# Patient Record
Sex: Female | Born: 2000
Health system: Southern US, Community
[De-identification: ages and names within clinical notes are randomized; demographics above are authoritative.]

## PROBLEM LIST (undated history)

## (undated) DIAGNOSIS — Z23 Encounter for immunization: Secondary | ICD-10-CM

## (undated) DIAGNOSIS — F32A Depression, unspecified: Secondary | ICD-10-CM

## (undated) DIAGNOSIS — F419 Anxiety disorder, unspecified: Secondary | ICD-10-CM

## (undated) DIAGNOSIS — F329 Major depressive disorder, single episode, unspecified: Secondary | ICD-10-CM

## (undated) HISTORY — DX: Depression, unspecified: F32.A

## (undated) HISTORY — DX: Anxiety disorder, unspecified: F41.9

## (undated) HISTORY — PX: HAND SURGERY: SHX662

## (undated) HISTORY — DX: Encounter for immunization: Z23

## (undated) HISTORY — PX: TRIGGER FINGER RELEASE: SHX641

---
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
## 2020-04-07 ENCOUNTER — Ambulatory Visit (INDEPENDENT_AMBULATORY_CARE_PROVIDER_SITE_OTHER)
Admission: RE | Admit: 2020-04-07 | Discharge: 2020-04-07 | Disposition: A | Discharge status: Home / Self Care | Payer: No Typology Code available for payment source | Source: Ambulatory Visit

## 2020-04-07 DIAGNOSIS — R42 Dizziness and giddiness: Secondary | ICD-10-CM

## 2020-04-07 MED ORDER — ONDANSETRON HCL 4 MG PO TABS: 4.0000 mg | ORAL_TABLET | Freq: Four times a day (QID) | ORAL | 0 refills | Status: DC

## 2020-04-07 NOTE — ED Provider Notes (Signed)
Virtual Visit via Video Note:  Deanna Bryant  initiated request for Telemedicine visit with [facility] team. I connected with Deanna Bryant  on 04/07/2020 at 10:45 AM  for a synchronized telemedicine visit using a video enabled HIPPA compliant telemedicine application. I verified that I am speaking with Deanna Bryant  using two identifiers. Mickie Bail, NP  was physically located in a [facility] Urgent care site and Keara Pagliarulo was located at a different location.   The limitations of evaluation and management by telemedicine as well as the availability of in-person appointments were discussed. Patient was informed that she  may incur a bill ( including co-pay) for this virtual visit encounter. Deanna Bryant  expressed understanding and gave verbal consent to proceed with virtual visit.     History of Present Illness:Deanna Bryant  is a 19 y.o. female presents for evaluation of 3 episodes of "vertigo" since 4 AM today.  She felt like she was "passing out and dizzy while I was asleep" then woke up and vomited in bed.  No history of similar episodes prior to today.  She denies weakness, numbness, headache, palpitations, chest pain, SOB, abdominal pain, dysuria, diarrhea, constipation, rash, or other symptoms.  Treatment attempted at home with increased water intake.  She denies current pregnancy or breastfeeding.      No Known Allergies   Past Medical History:  Diagnosis Date  . Anxiety   . Depression      Social History   Tobacco Use  . Smoking status: Never Smoker  . Smokeless tobacco: Never Used  Vaping Use  . Vaping Use: Every day  Substance Use Topics  . Alcohol use: No  . Drug use: No    ROS: as stated in HPI.  All other systems reviewed and negative.      Observations/Objective: Physical Exam  VITALS: Patient denies fever. GENERAL: Alert, appears well and in no acute distress. HEENT: Atraumatic. Oral mucosa appears moist. NECK: Normal movements of the head and  neck. CARDIOPULMONARY: No increased WOB. Speaking in clear sentences. I:E ratio WNL.  MS: Moves all visible extremities without noticeable abnormality. PSYCH: Pleasant and cooperative, well-groomed. Speech normal rate and rhythm. Affect is appropriate. Insight and judgement are appropriate. Attention is focused, linear, and appropriate.  NEURO: CN grossly intact. Oriented as arrived to appointment on time with no prompting. Moves both UE equally.  SKIN: No obvious lesions, wounds, erythema, or cyanosis noted on face or hands.   Assessment and Plan:    ICD-10-CM   1. Dizziness  R42        Follow Up Instructions: Discussed limitations of video visit.  Treating nausea and vomiting with Zofran.  Instructed patient to come here to be seen in person or go to see her PCP if her symptoms persist or worsen.  Patient agrees to plan of care.    I discussed the assessment and treatment plan with the patient. The patient was provided an opportunity to ask questions and all were answered. The patient agreed with the plan and demonstrated an understanding of the instructions.   The patient was advised to call back or seek an in-person evaluation if the symptoms worsen or if the condition fails to improve as anticipated.      Mickie Bail, NP  04/07/2020 10:45 AM         Mickie Bail, NP 04/07/20 1045

## 2020-04-07 NOTE — Discharge Instructions (Signed)
Take the Zofran as directed.    Follow up with your primary care provider or come here to be seen in person if your symptoms are not improving.

## 2020-06-04 ENCOUNTER — Encounter: Payer: Self-pay | Admitting: Family Medicine

## 2020-06-05 ENCOUNTER — Telehealth (INDEPENDENT_AMBULATORY_CARE_PROVIDER_SITE_OTHER): Payer: No Typology Code available for payment source | Admitting: Family Medicine

## 2020-06-05 ENCOUNTER — Encounter: Payer: Self-pay | Admitting: Family Medicine

## 2020-06-05 VITALS — Ht 65.0 in | Wt 150.0 lb

## 2020-06-05 DIAGNOSIS — Z20822 Contact with and (suspected) exposure to covid-19: Secondary | ICD-10-CM | POA: Diagnosis not present

## 2020-06-05 NOTE — Progress Notes (Signed)
Name: Deanna Bryant   MRN: [id number]    DOB: 09/21/2001   Date:06/05/2020       Progress Note  Subjective:    Chief Complaint  Chief Complaint  Patient presents with  . URI    tested for covid on 23 which was pos(+) started having symptoms on 8/19.  Mom was dx on 17 started quarentining then.  Pt feeling better.  Has vacation planned for sept 1 and will be flying.  Pt has questions and concerns    I connected with  [name]  on 06/05/20 at  1:20 PM EDT by a video enabled telemedicine application and verified that I am speaking with the correct person using two identifiers.  I discussed the limitations of evaluation and management by telemedicine and the availability of in person appointments. The patient expressed understanding and agreed to proceed. Staff also discussed with the patient that there may be a patient responsible charge related to this service. Patient Location: home Provider Location: [facility] Additional Individuals present: none  HPI  Pt has been at home quarantining due to mother having COVID Tested neg on 8/15, pt was asx HA sx started on 05/28/2020 without many other sx Pt feels fine now She has been isolating at home, has not gone to work.   Patient Active Problem List   Diagnosis Date Noted  . Anxiety and depression 05/14/2019    Social History   Tobacco Use  . Smoking status: Current Every Day Smoker    Types: E-cigarettes  . Smokeless tobacco: Never Used  Substance Use Topics  . Alcohol use: No     Current Outpatient Medications:  .  etonogestrel (NEXPLANON) 68 MG IMPL implant, 1 each by Subdermal route once., Disp: , Rfl:   No Known Allergies  I personally reviewed active problem list, medication list, allergies, family history, social history, health maintenance, notes from last encounter, lab results, imaging with the patient/caregiver today.  Review of Systems  10 Systems reviewed and are negative for acute change except as  noted in the HPI.   Objective:   Virtual encounter, vitals limited, only able to obtain the following Today's Vitals   06/05/20 1059  Weight: 150 lb (68 kg)  Height: 5\' 5"  (1.651 m)   Body mass index is 24.96 kg/m. Nursing Note and Vital Signs reviewed.  Physical Exam Vitals and nursing note reviewed.  Constitutional:      General: She is not in acute distress.    Appearance: Normal appearance. She is well-developed. She is not ill-appearing, toxic-appearing or diaphoretic.  HENT:     Head: Normocephalic and atraumatic.  Eyes:     General:        Right eye: No discharge.        Left eye: No discharge.     Conjunctiva/sclera: Conjunctivae normal.  Neck:     Trachea: No tracheal deviation.  Cardiovascular:     Rate and Rhythm: Normal rate.  Pulmonary:     Effort: Pulmonary effort is normal. No respiratory distress.     Breath sounds: No stridor.  Skin:    Coloration: Skin is not jaundiced or pale.     Findings: No rash.  Neurological:     Mental Status: She is alert.  Psychiatric:        Mood and Affect: Mood normal.        Behavior: Behavior normal.     PE limited by telephone encounter  No results found for this or any  previous visit (from the past 72 hour(s)).  Assessment and Plan:     ICD-10-CM   1. Suspected COVID-19 virus infection  Z20.822    encouraged her to retest, but if she does not wish to she would be cleared from isolation and can return to work on 06/08/2020 if fever free and con't asx  2. Close exposure to COVID-19 virus  Z20.822    mother positive, pt has been quarantined, tested negative on 05/24/2020, developed sx on 05/28/2020     -Red flags and when to present for emergency care or RTC including fever >101.43F, chest pain, shortness of breath, new/worsening/un-resolving symptoms, reviewed with patient at time of visit. Follow up and care instructions discussed and provided in AVS. - I discussed the assessment and treatment plan with the  patient. The patient was provided an opportunity to ask questions and all were answered. The patient agreed with the plan and demonstrated an understanding of the instructions.  I provided 20+ minutes of non-face-to-face time during this encounter.  Danelle Berry, PA-C 06/05/20 1:44 PM

## 2020-06-05 NOTE — Patient Instructions (Signed)
Remain isolated until meeting CDC criteria:  . At least 10 days since symptom onset  . And 24 hours fever free without antipyretics  . And improvement in respiratory symptoms.   If symptoms started on 05/28/2020, then isolate through 06/07/2020 and can return to work/school etc on 06/08/2020.  Do not return or break isolation if you have fever or any recurrent or worsening symptoms.  Please consider getting the COVID vaccine to help boost your immune response to COVID and help you and help other prevent spread of illness and variants in the coming months and years.   COVID-19: Quarantine vs. Isolation QUARANTINE keeps someone who was in close contact with someone who has COVID-19 away from others. If you had close contact with a person who has COVID-19  Stay home until 14 days after your last contact.  Check your temperature twice a day and watch for symptoms of COVID-19.  If possible, stay away from people who are at higher-risk for getting very sick from COVID-19. ISOLATION keeps someone who is sick or tested positive for COVID-19 without symptoms away from others, even in their own home. If you are sick and think or know you have COVID-19  Stay home until after ? At least 10 days since symptoms first appeared and ? At least 24 hours with no fever without fever-reducing medication and ? Symptoms have improved If you tested positive for COVID-19 but do not have symptoms  Stay home until after ? 10 days have passed since your positive test If you live with others, stay in a specific "sick room" or area and away from other people or animals, including pets. Use a separate bathroom, if available. [email or web address] 04/29/2019 This information is not intended to replace advice given to you by your health care provider. Make sure you discuss any questions you have with your health care provider. Document Revised: 09/12/2019 Document Reviewed: 09/12/2019 Elsevier Patient Education   2020 [employer].
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
## 2020-07-02 ENCOUNTER — Encounter: Payer: Self-pay | Admitting: Family Medicine

## 2020-07-02 ENCOUNTER — Other Ambulatory Visit (HOSPITAL_COMMUNITY)
Admission: RE | Admit: 2020-07-02 | Discharge: 2020-07-02 | Disposition: A | Discharge status: Home / Self Care | Payer: No Typology Code available for payment source | Source: Ambulatory Visit | Attending: Family Medicine | Admitting: Family Medicine

## 2020-07-02 ENCOUNTER — Other Ambulatory Visit: Payer: Self-pay

## 2020-07-02 ENCOUNTER — Ambulatory Visit (INDEPENDENT_AMBULATORY_CARE_PROVIDER_SITE_OTHER): Payer: No Typology Code available for payment source | Admitting: Family Medicine

## 2020-07-02 VITALS — BP 120/72 | HR 67 | Temp 98.1°F | Resp 16 | Ht 65.0 in | Wt 157.3 lb

## 2020-07-02 DIAGNOSIS — Z0000 Encounter for general adult medical examination without abnormal findings: Secondary | ICD-10-CM

## 2020-07-02 DIAGNOSIS — Z202 Contact with and (suspected) exposure to infections with a predominantly sexual mode of transmission: Secondary | ICD-10-CM

## 2020-07-02 NOTE — Patient Instructions (Signed)
Preventive Care 18-19 Years Old, Female Preventive care refers to lifestyle choices and visits with your health care provider that can promote health and wellness. At this stage in your life, you may start seeing a primary care physician instead of a pediatrician. Your health care is now your responsibility. Preventive care for young adults includes:  A yearly physical exam. This is also called an annual wellness visit.  Regular dental and eye exams.  Immunizations.  Screening for certain conditions.  Healthy lifestyle choices, such as diet and exercise. What can I expect for my preventive care visit? Physical exam Your health care provider may check:  Height and weight. These may be used to calculate body mass index (BMI), which is a measurement that tells if you are at a healthy weight.  Heart rate and blood pressure.  Body temperature. Counseling Your health care provider may ask you questions about:  Past medical problems and family medical history.  Alcohol, tobacco, and drug use.  Home and relationship well-being.  Access to firearms.  Emotional well-being.  Diet, exercise, and sleep habits.  Sexual activity and sexual health.  Method of birth control.  Menstrual cycle.  Pregnancy history. What immunizations do I need?  Influenza (flu) vaccine  This is recommended every year. Tetanus, diphtheria, and pertussis (Tdap) vaccine  You may need a Td booster every 10 years. Varicella (chickenpox) vaccine  You may need this vaccine if you have not already been vaccinated. Human papillomavirus (HPV) vaccine  If recommended by your health care provider, you may need three doses over 6 months. Measles, mumps, and rubella (MMR) vaccine  You may need at least one dose of MMR. You may also need a second dose. Meningococcal conjugate (MenACWY) vaccine  One dose is recommended if you are 19-19 years old and a first-year college student living in a residence hall,  or if you have one of several medical conditions. You may also need additional booster doses. Pneumococcal conjugate (PCV13) vaccine  You may need this if you have certain conditions and were not previously vaccinated. Pneumococcal polysaccharide (PPSV23) vaccine  You may need one or two doses if you smoke cigarettes or if you have certain conditions. Hepatitis A vaccine  You may need this if you have certain conditions or if you travel or work in places where you may be exposed to hepatitis A. Hepatitis B vaccine  You may need this if you have certain conditions or if you travel or work in places where you may be exposed to hepatitis B. Haemophilus influenzae type b (Hib) vaccine  You may need this if you have certain risk factors. You may receive vaccines as individual doses or as more than one vaccine together in one shot (combination vaccines). Talk with your health care provider about the risks and benefits of combination vaccines. What tests do I need? Blood tests  Lipid and cholesterol levels. These may be checked every 5 years starting at age 20.  Hepatitis C test.  Hepatitis B test. Screening  Pelvic exam and Pap test. This may be done every 3 years starting at age 19.  Sexually transmitted disease (STD) testing, if you are at risk.  BRCA-related cancer screening. This may be done if you have a family history of breast, ovarian, tubal, or peritoneal cancers. Other tests  Tuberculosis skin test.  Vision and hearing tests.  Skin exam.  Breast exam. Follow these instructions at home: Eating and drinking   Eat a diet that includes fresh fruits and   vegetables, whole grains, lean protein, and low-fat dairy products.  Drink enough fluid to keep your urine pale yellow.  Do not drink alcohol if: ? Your health care provider tells you not to drink. ? You are pregnant, may be pregnant, or are planning to become pregnant. ? You are under the legal drinking age. In the  [place] the legal drinking age is 55.  If you drink alcohol: ? Limit how much you have to 0-1 drink a day. ? Be aware of how much alcohol is in your drink. In the [place] one drink equals one 12 oz bottle of beer (355 mL), one 5 oz glass of wine (148 mL), or one 1 oz glass of hard liquor (44 mL). Lifestyle  Take daily care of your teeth and gums.  Stay active. Exercise at least 30 minutes 5 or more days of the week.  Do not use any products that contain nicotine or tobacco, such as cigarettes, e-cigarettes, and chewing tobacco. If you need help quitting, ask your health care provider.  Do not use drugs.  If you are sexually active, practice safe sex. Use a condom or other form of birth control (contraception) in order to prevent pregnancy and STIs (sexually transmitted infections). If you plan to become pregnant, see your health care provider for a pre-conception visit.  Find healthy ways to cope with stress, such as: ? Meditation, yoga, or listening to music. ? Journaling. ? Talking to a trusted person. ? Spending time with friends and family. Safety  Always wear your seat belt while driving or riding in a vehicle.  Do not drive if you have been drinking alcohol. Do not ride with someone who has been drinking.  Do not drive when you are tired or distracted. Do not text while driving.  Wear a helmet and other protective equipment during sports activities.  If you have firearms in your house, make sure you follow all gun safety procedures.  Seek help if you have been bullied, physically abused, or sexually abused.  Use the Internet responsibly to avoid dangers such as online bullying and online sex predators. What's next?  Go to your health care provider once a year for a well check visit.  Ask your health care provider how often you should have your eyes and teeth checked.  Stay up to date on all vaccines. This information is not intended to replace advice given to you by  your health care provider. Make sure you discuss any questions you have with your health care provider. Document Revised: 09/20/2018 Document Reviewed: 09/20/2018 Elsevier Patient Education  2020 [employer] Sexually Transmitted Infections, Adult Sexually transmitted infections (STIs) are diseases that are passed (transmitted) from person to person through bodily fluids exchanged during sex or sexual contact. Bodily fluids include saliva, semen, blood, vaginal mucus, and urine. You may have an increased risk for developing an STI if you have unprotected oral, vaginal, or anal sex. Some common STIs include:  Herpes.  Hepatitis B.  Chlamydia.  Gonorrhea.  Syphilis.  HPV (human papillomavirus).  HIV (human immunodeficiency virus), the virus that can cause AIDS (acquired immunodeficiency syndrome). How can I protect myself from sexually transmitted infections? The only way to completely prevent STIs is not to have sex of any kind (practice abstinence). This includes oral, vaginal, or anal sex. If you are sexually active, take these actions to lower your risk of getting an STI:  Have only one sex partner (be monogamous) or limit  the number of sexual partners you have.  Stay up-to-date on immunizations. Certain vaccines can lower your risk of getting certain STIs, such as: ? Hepatitis A and B vaccines. You may have been vaccinated as a young child, but likely need a booster shot as a teen or young adult. ? HPV vaccine.  Use methods that prevent the exchange of body fluids between partners (barrier protection) every time you have sex. Barrier protection can be used during oral, vaginal, or anal sex. Commonly used barrier methods include: ? Female condom. ? Female condom. ? Dental dam.  Get tested regularly for STIs. Have your sexual partner get tested regularly as well.  Avoid mixing alcohol, drugs, and sex. Alcohol and drug use can affect your ability to make good  decisions and can lead to risky sexual behaviors.  Ask your health care provider about taking pre-exposure prophylaxis (PrEP) to prevent HIV infection if you: ? Have a HIV-positive sexual partner. ? Have multiple sexual partners or partners who do not know their HIV status, and do not regularly use a condom during sex. ? Use injection drugs and share needles. Birth control pills, injections, implants, and intrauterine devices (IUDs) do not protect against STIs. To prevent both STIs and pregnancy, always use a condom with another form of birth control. Some STIs, such as herpes, are spread through skin to skin contact. A condom does not protect you from getting such STIs. If you or your partner have herpes and there is an active flare with open sores, avoid all sexual contact. Why are these changes important? Taking steps to practice safe sex protects you and others. Many STIs can be cured. However, some STIs are not curable and will affect you for the rest of your life. STIs can be passed on to another person even if you do not have symptoms. What can happen if changes are not made? Certain STIs may:  Require you to take medicine for the rest of your life.  Affect your ability to have children (your fertility).  Increase your risk for developing another STI or certain serious health conditions, such as: ? Cervical cancer. ? Head and neck cancer. ? Pelvic inflammatory disease (PID) in women. ? Organ damage or damage to other parts of your body, if the infection spreads.  Be passed to a baby during childbirth. How are sexually transmitted infections treated? If you or your partner know or think that you may have an STI:  Talk with your health care provider about what can be done to treat it. Some STIs can be treated and cured with medicines.  For curable STIs, you and your partner should avoid sex during treatment and for several days after treatment is complete.  You and your partner  should both be treated at the same time, if there is any chance that your partner is infected as well. If you get treatment but your partner does not, your partner can re-infect you when you resume sexual contact.  Do not have unprotected sex. Where to find more information Learn more about sexually transmitted diseases and infections from:  Centers for Disease Control and Prevention: ? More information about specific STIs: AppraiserFraud.fi ? Find places to get sexual health counseling and treatment for free or for a low cost: gettested.StoreMirror.com.cy  [place] Department of Health and Human Services: http://white.info/.html Summary  The only way to completely prevent STIs is not to have sex (practice abstinence), including oral, vaginal, or anal sex.  STIs can spread through saliva,  semen, blood, vaginal mucus, urine, or sexual contact.  If you do have sex, limit your number of sexual partners and use a barrier protection method every time you have sex.  If you develop an STI, get treated right away and ask your partner to be treated as well. Do not resume having sex until both of you have completed treatment for the STI. This information is not intended to replace advice given to you by your health care provider. Make sure you discuss any questions you have with your health care provider. Document Revised: 02/19/2019 Document Reviewed: 09/22/2016 Elsevier Patient Education  2020 [employer].

## 2020-07-03 ENCOUNTER — Other Ambulatory Visit: Payer: Self-pay | Admitting: Family Medicine

## 2020-07-03 LAB — CERVICOVAGINAL ANCILLARY ONLY
Bacterial Vaginitis (gardnerella): POSITIVE — AB
Candida Glabrata: NEGATIVE
Candida Vaginitis: POSITIVE — AB
Chlamydia: NEGATIVE
Comment: NEGATIVE
Comment: NEGATIVE
Comment: NEGATIVE
Comment: NEGATIVE
Comment: NEGATIVE
Comment: NORMAL
Neisseria Gonorrhea: NEGATIVE
Trichomonas: NEGATIVE

## 2020-07-03 LAB — CBC WITH DIFFERENTIAL/PLATELET
Absolute Monocytes: 599 cells/uL (ref 200–950)
Basophils Absolute: 38 cells/uL (ref 0–200)
Basophils Relative: 0.6 %
Eosinophils Absolute: 88 cells/uL (ref 15–500)
Eosinophils Relative: 1.4 %
HCT: 41.6 % (ref 35.0–45.0)
Hemoglobin: 13.9 g/dL (ref 11.7–15.5)
Lymphs Abs: 1613 cells/uL (ref 850–3900)
MCH: 29.4 pg (ref 27.0–33.0)
MCHC: 33.4 g/dL (ref 32.0–36.0)
MCV: 88.1 fL (ref 80.0–100.0)
MPV: 10.8 fL (ref 7.5–12.5)
Monocytes Relative: 9.5 %
Neutro Abs: 3963 cells/uL (ref 1500–7800)
Neutrophils Relative %: 62.9 %
Platelets: 209 10*3/uL (ref 140–400)
RBC: 4.72 10*6/uL (ref 3.80–5.10)
RDW: 12.4 % (ref 11.0–15.0)
Total Lymphocyte: 25.6 %
WBC: 6.3 10*3/uL (ref 3.8–10.8)

## 2020-07-03 LAB — COMPLETE METABOLIC PANEL WITH GFR
AG Ratio: 1.5 (calc) (ref 1.0–2.5)
ALT: 7 U/L (ref 5–32)
AST: 12 U/L (ref 12–32)
Albumin: 4.5 g/dL (ref 3.6–5.1)
Alkaline phosphatase (APISO): 54 U/L (ref 36–128)
BUN: 7 mg/dL (ref 7–20)
CO2: 27 mmol/L (ref 20–32)
Calcium: 10 mg/dL (ref 8.9–10.4)
Chloride: 104 mmol/L (ref 98–110)
Creat: 0.76 mg/dL (ref 0.50–1.00)
GFR, Est African American: 132 mL/min/{1.73_m2} (ref >=60)
GFR, Est Non African American: 114 mL/min/{1.73_m2} (ref >=60)
Globulin: 3.1 g/dL (calc) (ref 2.0–3.8)
Glucose, Bld: 83 mg/dL (ref 65–99)
Potassium: 4.2 mmol/L (ref 3.8–5.1)
Sodium: 138 mmol/L (ref 135–146)
Total Bilirubin: 0.9 mg/dL (ref 0.2–1.1)
Total Protein: 7.6 g/dL (ref 6.3–8.2)

## 2020-07-03 LAB — HIV ANTIBODY (ROUTINE TESTING W REFLEX): HIV 1&2 Ab, 4th Generation: NONREACTIVE

## 2020-07-03 LAB — RPR: RPR Ser Ql: NONREACTIVE

## 2020-07-03 MED ORDER — FLUCONAZOLE 150 MG PO TABS: 150.0000 mg | ORAL_TABLET | Freq: Once | ORAL | 0 refills | Status: AC

## 2020-07-03 MED ORDER — METRONIDAZOLE 500 MG PO TABS: 500.0000 mg | ORAL_TABLET | Freq: Two times a day (BID) | ORAL | 0 refills | Status: DC

## 2020-07-16 ENCOUNTER — Encounter: Payer: No Typology Code available for payment source | Admitting: Family Medicine

## 2020-12-28 NOTE — Patient Instructions (Signed)
I value your feedback and you entrusting us with your care. If you get a [facility] patient survey, I would appreciate you taking the time to let us know about your experience today. Thank you! ? ? ?
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
## 2020-12-29 ENCOUNTER — Ambulatory Visit (INDEPENDENT_AMBULATORY_CARE_PROVIDER_SITE_OTHER): Payer: No Typology Code available for payment source | Admitting: Obstetrics and Gynecology

## 2020-12-29 ENCOUNTER — Other Ambulatory Visit: Payer: Self-pay

## 2020-12-29 ENCOUNTER — Other Ambulatory Visit (HOSPITAL_COMMUNITY)
Admission: RE | Admit: 2020-12-29 | Discharge: 2020-12-29 | Disposition: A | Discharge status: Home / Self Care | Payer: No Typology Code available for payment source | Source: Ambulatory Visit | Attending: Obstetrics and Gynecology | Admitting: Obstetrics and Gynecology

## 2020-12-29 ENCOUNTER — Encounter: Payer: Self-pay | Admitting: Obstetrics and Gynecology

## 2020-12-29 VITALS — BP 100/60 | Ht 65.0 in | Wt 152.0 lb

## 2020-12-29 DIAGNOSIS — Z113 Encounter for screening for infections with a predominantly sexual mode of transmission: Secondary | ICD-10-CM | POA: Insufficient documentation

## 2020-12-29 DIAGNOSIS — N94819 Vulvodynia, unspecified: Secondary | ICD-10-CM

## 2020-12-29 DIAGNOSIS — N930 Postcoital and contact bleeding: Secondary | ICD-10-CM | POA: Insufficient documentation

## 2020-12-29 DIAGNOSIS — Z01419 Encounter for gynecological examination (general) (routine) without abnormal findings: Secondary | ICD-10-CM | POA: Diagnosis not present

## 2020-12-29 DIAGNOSIS — Z3046 Encounter for surveillance of implantable subdermal contraceptive: Secondary | ICD-10-CM | POA: Diagnosis not present

## 2020-12-29 DIAGNOSIS — N941 Unspecified dyspareunia: Secondary | ICD-10-CM | POA: Diagnosis not present

## 2020-12-31 LAB — CERVICOVAGINAL ANCILLARY ONLY
Chlamydia: NEGATIVE
Comment: NEGATIVE
Comment: NORMAL
Neisseria Gonorrhea: NEGATIVE

## 2021-01-01 NOTE — Addendum Note (Signed)
Addended by: Althea Grimmer B on: 01/01/2021 10:58 AM   Modules accepted: Orders

## 2021-01-05 ENCOUNTER — Other Ambulatory Visit: Payer: Self-pay | Admitting: Family Medicine

## 2021-01-05 ENCOUNTER — Other Ambulatory Visit: Payer: Self-pay

## 2021-01-05 ENCOUNTER — Ambulatory Visit (INDEPENDENT_AMBULATORY_CARE_PROVIDER_SITE_OTHER): Payer: No Typology Code available for payment source

## 2021-01-05 ENCOUNTER — Ambulatory Visit
Admission: EM | Admit: 2021-01-05 | Discharge: 2021-01-05 | Disposition: A | Discharge status: Home / Self Care | Payer: No Typology Code available for payment source | Attending: Family Medicine | Admitting: Family Medicine

## 2021-01-05 ENCOUNTER — Encounter: Payer: Self-pay | Admitting: Emergency Medicine

## 2021-01-05 DIAGNOSIS — W1830XA Fall on same level, unspecified, initial encounter: Secondary | ICD-10-CM

## 2021-01-05 DIAGNOSIS — S93601A Unspecified sprain of right foot, initial encounter: Secondary | ICD-10-CM

## 2021-01-05 DIAGNOSIS — S93602A Unspecified sprain of left foot, initial encounter: Secondary | ICD-10-CM

## 2021-01-05 MED ORDER — MELOXICAM 15 MG PO TABS: 15.0000 mg | ORAL_TABLET | Freq: Every day | ORAL | 0 refills | Status: DC | PRN

## 2021-01-05 NOTE — Discharge Instructions (Signed)
Rest, ice, elevation.  Take care  Dr. Adriana Simas

## 2021-01-05 NOTE — ED Triage Notes (Signed)
Patient states she was running with her dogs yesterday and tripped and fell. She is c/o right ankle/foot pain and left foot pain on the inside of her foot.

## 2021-01-05 NOTE — ED Provider Notes (Signed)
[facility]    CSN: [id number] Arrival date & time: 01/05/21  1206   History   Chief Complaint Chief Complaint  Patient presents with  . Foot Pain  . Foot Injury        HPI  20 year old female presents with the above complaints.  Patient states that she was running with her dog yesterday.  She got tripped and fell.  She reports pain of her right and left feet.  Pain is 5/10 in severity.  Worse with bearing weight.  She is able to walk.  No relieving factors.  She has taken over-the-counter analgesics without improvement.  No other complaints.  Past Medical History:  Diagnosis Date  . Anxiety   . Depression   . Vaccine for human papilloma virus (HPV) types 6, 11, 16, and 18 administered     Patient Active Problem List   Diagnosis Date Noted  . Dyspareunia in female 12/29/2020  . Vulvodynia 12/29/2020  . Anxiety and depression 05/14/2019    Past Surgical History:  Procedure Laterality Date  . HAND SURGERY    . TRIGGER FINGER RELEASE      OB History    Gravida  0   Para  0   Term  0   Preterm  0   AB  0   Living  0     SAB  0   IAB  0   Ectopic  0   Multiple  0   Live Births  0            Home Medications    Prior to Admission medications   Medication Sig Start Date End Date Taking? Authorizing Provider  meloxicam (MOBIC) 15 MG tablet Take 1 tablet (15 mg total) by mouth daily as needed for pain. 01/05/21  Yes Norva Bowe G, DO  etonogestrel (NEXPLANON) 68 MG IMPL implant 1 each by Subdermal route once. Early 2021 approx insertion date    [provider]    Family History Family History  Problem Relation Age of Onset  . Diabetes Mother   . Thyroid cancer Mother   . Diabetes Father   . Diabetes Maternal Grandfather     Social History Social History   Tobacco Use  . Smoking status: Current Every Day Smoker    Types: E-cigarettes  . Smokeless tobacco: Never Used  Vaping Use  . Vaping Use: Every day   Substance Use Topics  . Alcohol use: No  . Drug use: No     Allergies   Patient has no known allergies.   Review of Systems Review of Systems  Constitutional: Negative.   Musculoskeletal:       Foot pain.   Physical Exam Triage Vital Signs ED Triage Vitals  Enc Vitals Group     BP 01/05/21 1225 109/60     Pulse Rate 01/05/21 1225 76     Resp 01/05/21 1224 18     Temp 01/05/21 1224 98.2 F (36.8 C)     Temp Source 01/05/21 1224 Oral     SpO2 01/05/21 1225 100 %     Weight 01/05/21 1223 150 lb (68 kg)     Height 01/05/21 1223 5\' 5"  (1.651 m)     Head Circumference --      Peak Flow --      Pain Score 01/05/21 1222 5     Pain Loc --      Pain Edu? --      Excl. in GC? --  Updated Vital Signs BP 109/60   Pulse 76   Temp 98.2 F (36.8 C) (Oral)   Resp 18   Ht 5\' 5"  (1.651 m)   Wt 68 kg   LMP 12/12/2020 (Exact Date)   SpO2 100%   BMI 24.96 kg/m   Visual Acuity Right Eye Distance:   Left Eye Distance:   Bilateral Distance:    Right Eye Near:   Left Eye Near:    Bilateral Near:     Physical Exam Constitutional:      General: She is not in acute distress.    Appearance: Normal appearance. She is not ill-appearing.  Eyes:     General:        Right eye: No discharge.        Left eye: No discharge.     Conjunctiva/sclera: Conjunctivae normal.  Pulmonary:     Effort: Pulmonary effort is normal. No respiratory distress.  Musculoskeletal:     Comments: Left foot -no significant tenderness on exam.  Right foot -tenderness over the base of the fifth metatarsal.  Neurological:     Mental Status: She is alert.  Psychiatric:        Mood and Affect: Mood normal.        Behavior: Behavior normal.    UC Treatments / Results  Labs (all labs ordered are listed, but only abnormal results are displayed) Labs Reviewed - No data to display  EKG   Radiology DG Foot Complete Left  Result Date: 01/05/2021 CLINICAL DATA:  Patient states she was running  with her dogs yesterday and tripped and fell. She is c/o right ankle/foot pain and left foot pain on the inside of her foot. pain left medial IP joint 1st MT. Most pain right foot across top and la.*comment was truncated*Fall yesterday EXAM: LEFT FOOT - COMPLETE 3+ VIEW COMPARISON:  None. FINDINGS: No fracture or dislocation of mid foot or forefoot. The phalanges are normal. The calcaneus is normal. No soft tissue abnormality. IMPRESSION: No fracture or dislocation. Electronically Signed   By: 01/07/2021 M.D.   On: 01/05/2021 13:18   DG Foot Complete Right  Result Date: 01/05/2021 CLINICAL DATA:  Fall yesterday with medial foot pain EXAM: RIGHT FOOT COMPLETE - 3+ VIEW COMPARISON:  None. FINDINGS: No fracture or dislocation. Lisfranc joint appears intact. No focal osseous lesions. No significant arthropathy. No radiopaque foreign bodies. IMPRESSION: No fracture or malalignment. Electronically Signed   By: 01/07/2021 M.D.   On: 01/05/2021 13:20    Procedures Procedures (including critical care time)  Medications Ordered in UC Medications - No data to display  Initial Impression / Assessment and Plan / UC Course  I have reviewed the triage vital signs and the nursing notes.  Pertinent labs & imaging results that were available during my care of the patient were reviewed by me and considered in my medical decision making (see chart for details).    20 year old female presents with injury to both feet.  X-rays were obtained and were independent reviewed by me.  Interpretation: Normal x-rays of the left and right feet.  No appreciable fracture or dislocation.  Advise rest, ice, elevation.  Meloxicam as directed.  Supportive care.  Final Clinical Impressions(s) / UC Diagnoses   Final diagnoses:  Sprain of right foot, initial encounter  Sprain of left foot, initial encounter     Discharge Instructions     Rest, ice, elevation.  Take care  Dr. 26    ED Prescriptions  Medication Sig Dispense Auth. Provider   meloxicam (MOBIC) 15 MG tablet Take 1 tablet (15 mg total) by mouth daily as needed for pain. 30 tablet Tommie Sams, DO     PDMP not reviewed this encounter.   Tommie Sams, [place] 01/05/21 1409

## 2021-02-12 ENCOUNTER — Ambulatory Visit
Admission: RE | Admit: 2021-02-12 | Discharge: 2021-02-12 | Disposition: A | Discharge status: Home / Self Care | Payer: No Typology Code available for payment source | Source: Ambulatory Visit | Attending: Obstetrics and Gynecology | Admitting: Obstetrics and Gynecology

## 2021-02-12 ENCOUNTER — Other Ambulatory Visit: Payer: Self-pay

## 2021-02-12 DIAGNOSIS — N930 Postcoital and contact bleeding: Secondary | ICD-10-CM | POA: Diagnosis present

## 2021-02-12 DIAGNOSIS — N941 Unspecified dyspareunia: Secondary | ICD-10-CM | POA: Diagnosis not present

## 2021-02-24 ENCOUNTER — Other Ambulatory Visit: Payer: Self-pay

## 2021-02-24 MED ORDER — NAPROXEN 500 MG PO TABS
ORAL_TABLET | ORAL | 1 refills | Status: DC
  Filled 2021-02-24: qty 20, 10d supply, fill #0

## 2021-02-24 MED ORDER — CYCLOBENZAPRINE HCL 5 MG PO TABS
ORAL_TABLET | ORAL | 0 refills | Status: DC
  Filled 2021-02-24: qty 30, 10d supply, fill #0

## 2021-04-27 ENCOUNTER — Ambulatory Visit
Admission: EM | Admit: 2021-04-27 | Discharge: 2021-04-27 | Disposition: A | Discharge status: Home / Self Care | Payer: No Typology Code available for payment source | Attending: Physician Assistant | Admitting: Physician Assistant

## 2021-04-27 ENCOUNTER — Other Ambulatory Visit: Payer: Self-pay

## 2021-04-27 ENCOUNTER — Encounter: Payer: Self-pay | Admitting: Emergency Medicine

## 2021-04-27 ENCOUNTER — Encounter: Payer: Self-pay | Admitting: Family Medicine

## 2021-04-27 DIAGNOSIS — J029 Acute pharyngitis, unspecified: Secondary | ICD-10-CM | POA: Diagnosis present

## 2021-04-27 DIAGNOSIS — R059 Cough, unspecified: Secondary | ICD-10-CM | POA: Insufficient documentation

## 2021-04-27 DIAGNOSIS — J039 Acute tonsillitis, unspecified: Secondary | ICD-10-CM | POA: Insufficient documentation

## 2021-04-27 LAB — POCT RAPID STREP A: Streptococcus, Group A Screen (Direct): NEGATIVE

## 2021-04-27 MED ORDER — AZITHROMYCIN 250 MG PO TABS
250.0000 mg | ORAL_TABLET | Freq: Every day | ORAL | 0 refills | Status: DC
  Filled 2021-04-27: qty 6, 5d supply, fill #0

## 2021-04-27 MED ORDER — PREDNISONE 20 MG PO TABS
40.0000 mg | ORAL_TABLET | Freq: Every day | ORAL | 0 refills | Status: AC
  Filled 2021-04-27: qty 10, 5d supply, fill #0

## 2021-04-27 NOTE — Discharge Instructions (Addendum)
Your strep test was negative. I will still treat your for possibility of bacterial tonsil infection since you have had symptoms for a month, but as we discussed this could be viral such as mono but I do not have a way to test you this evening. Also it would not necessarily change the course of treatment. Increase rest and fluids. See your PCP if not feeling better in a week.

## 2021-04-27 NOTE — ED Triage Notes (Signed)
Pt c/o cough, hoarseness and sore throat. Started about a month ago. She states she has taken 2 covid test and were negative. Denies fever.

## 2021-04-27 NOTE — ED Provider Notes (Signed)
[facility]    CSN: [id number] Arrival date & time: 04/27/21  1825      History   Chief Complaint Chief Complaint  Patient presents with   Cough    HPI Deanna Bryant is a 20 y.o. female presenting for approximately 1 month history of a sore throat, swollen ankles, voice hoarseness and cough.  Patient says that her boyfriend is ill with similar symptoms and so are multiple family members of his.  She says they have all been sick for a month.  She says she is taking 2 COVID tests which have been negative.  She denies any known COVID exposure.  She has not had any fevers, fatigue, body aches, congestion, chest pain, shortness of breath, nausea/vomiting or diarrhea.  Patient says she is taking multiple over-the-counter medications without relief of her symptoms.  No other complaints.  HPI  Past Medical History:  Diagnosis Date   Anxiety    Depression    Vaccine for human papilloma virus (HPV) types 6, 11, 16, and 18 administered     Patient Active Problem List   Diagnosis Date Noted   Dyspareunia in female 12/29/2020   Vulvodynia 12/29/2020   Anxiety and depression 05/14/2019    Past Surgical History:  Procedure Laterality Date   HAND SURGERY     TRIGGER FINGER RELEASE      OB History     Gravida  0   Para  0   Term  0   Preterm  0   AB  0   Living  0      SAB  0   IAB  0   Ectopic  0   Multiple  0   Live Births  0            Home Medications    Prior to Admission medications   Medication Sig Start Date End Date Taking? Authorizing Provider  azithromycin (ZITHROMAX) 250 MG tablet Take 1 tablet (250 mg total) by mouth daily. Take first 2 tablets together, then 1 every day until finished. 04/27/21  Yes Shirlee Latch, PA-C  etonogestrel (NEXPLANON) 68 MG IMPL implant 1 each by Subdermal route once. Early 2021 approx insertion date   Yes [provider]  predniSONE (DELTASONE) 20 MG tablet Take 2 tablets (40 mg total) by  mouth daily for 5 days. 04/27/21 05/02/21 Yes Shirlee Latch, PA-C  cyclobenzaprine (FLEXERIL) 5 MG tablet Take 1 tablet (5 mg total) by mouth 3 (three) times daily as needed for Muscle spasms for up to 10 days 02/24/21     meloxicam (MOBIC) 15 MG tablet TAKE 1 TABLET (15 MG TOTAL) BY MOUTH DAILY AS NEEDED FOR PAIN. 01/05/21 01/05/22  Tommie Sams, DO  naproxen (NAPROSYN) 500 MG tablet Take 1 tablet (500 mg total) by mouth 2 (two) times daily as needed for up to 10 days Take with food. 02/24/21       Family History Family History  Problem Relation Age of Onset   Diabetes Mother    Thyroid cancer Mother    Diabetes Father    Diabetes Maternal Grandfather     Social History Social History   Tobacco Use   Smoking status: Former    Types: E-cigarettes   Smokeless tobacco: Never  Vaping Use   Vaping Use: Former  Substance Use Topics   Alcohol use: No   Drug use: No     Allergies   Patient has no known allergies.   Review  of Systems Review of Systems  Constitutional:  Negative for chills, diaphoresis, fatigue and fever.  HENT:  Positive for sore throat and voice change. Negative for congestion, ear pain, rhinorrhea, sinus pressure and sinus pain.   Respiratory:  Positive for cough. Negative for shortness of breath.   Gastrointestinal:  Negative for abdominal pain, nausea and vomiting.  Musculoskeletal:  Negative for arthralgias and myalgias.  Skin:  Negative for rash.  Neurological:  Negative for weakness and headaches.  Hematological:  Positive for adenopathy.    Physical Exam Triage Vital Signs ED Triage Vitals  Enc Vitals Group     BP 04/27/21 1840 121/74     Pulse Rate 04/27/21 1840 79     Resp 04/27/21 1840 18     Temp 04/27/21 1840 98.5 F (36.9 C)     Temp Source 04/27/21 1840 Oral     SpO2 04/27/21 1840 100 %     Weight 04/27/21 1841 149 lb 14.6 oz (68 kg)     Height 04/27/21 1841 5\' 5"  (1.651 m)     Head Circumference --      Peak Flow --      Pain Score  04/27/21 1841 4     Pain Loc --      Pain Edu? --      Excl. in GC? --    No data found.  Updated Vital Signs BP 121/74 (BP Location: Right Arm)   Pulse 79   Temp 98.5 F (36.9 C) (Oral)   Resp 18   Ht 5\' 5"  (1.651 m)   Wt 149 lb 14.6 oz (68 kg)   SpO2 100%   BMI 24.95 kg/m      Physical Exam Vitals and nursing note reviewed.  Constitutional:      General: She is not in acute distress.    Appearance: Normal appearance. She is not ill-appearing or toxic-appearing.  HENT:     Head: Normocephalic and atraumatic.     Nose: Nose normal.     Mouth/Throat:     Mouth: Mucous membranes are moist.     Pharynx: Oropharynx is clear. Posterior oropharyngeal erythema present.     Tonsils: No tonsillar exudate. 1+ on the right. 1+ on the left.  Eyes:     General: No scleral icterus.       Right eye: No discharge.        Left eye: No discharge.     Conjunctiva/sclera: Conjunctivae normal.  Cardiovascular:     Rate and Rhythm: Normal rate and regular rhythm.     Heart sounds: Normal heart sounds.  Pulmonary:     Effort: Pulmonary effort is normal. No respiratory distress.     Breath sounds: Normal breath sounds.  Musculoskeletal:     Cervical back: Neck supple.  Lymphadenopathy:     Cervical: Cervical adenopathy present.  Skin:    General: Skin is dry.  Neurological:     General: No focal deficit present.     Mental Status: She is alert. Mental status is at baseline.     Motor: No weakness.     Gait: Gait normal.  Psychiatric:        Mood and Affect: Mood normal.        Behavior: Behavior normal.        Thought Content: Thought content normal.     UC Treatments / Results  Labs (all labs ordered are listed, but only abnormal results are displayed) Labs Reviewed  CULTURE, GROUP A STREP (  [facility])  POCT RAPID STREP A, ED / UC  POCT RAPID STREP A    EKG   Radiology No results found.  Procedures Procedures (including critical care time)  Medications Ordered in  UC Medications - No data to display  Initial Impression / Assessment and Plan / UC Course  I have reviewed the triage vital signs and the nursing notes.  Pertinent labs & imaging results that were available during my care of the patient were reviewed by me and considered in my medical decision making (see chart for details).   20 y/o female presenting for 1 month history of sore throat, painful swallowing, enlarged lymph nodes on swollen tonsils.  Also notes a cough.  Rapid strep test negative.  Culture sent.  Treating patient at this time for atypical bacterial tonsillitis.  Treating with azithromycin.  Also sent in prednisone since her tonsils are swollen.  Advised increased rest and fluids.  Follow-up PCP if not improving after she finishes the antibiotic. F/u with [place] as needed.   Final Clinical Impressions(s) / UC Diagnoses   Final diagnoses:  Acute tonsillitis, unspecified etiology  Sore throat  Cough     Discharge Instructions      Your strep test was negative. I will still treat your for possibility of bacterial tonsil infection since you have had symptoms for a month, but as we discussed this could be viral such as mono but I do not have a way to test you this evening. Also it would not necessarily change the course of treatment. Increase rest and fluids. See your PCP if not feeling better in a week.     ED Prescriptions     Medication Sig Dispense Auth. Provider   azithromycin (ZITHROMAX) 250 MG tablet Take 1 tablet (250 mg total) by mouth daily. Take first 2 tablets together, then 1 every day until finished. 6 tablet Eusebio Friendly B, PA-C   predniSONE (DELTASONE) 20 MG tablet Take 2 tablets (40 mg total) by mouth daily for 5 days. 10 tablet Gareth Morgan      PDMP not reviewed this encounter.   Shirlee Latch, PA-C 04/27/21 1928

## 2021-04-28 ENCOUNTER — Other Ambulatory Visit: Payer: Self-pay

## 2021-04-30 LAB — CULTURE, GROUP A STREP (THRC)

## 2021-07-05 ENCOUNTER — Encounter: Payer: No Typology Code available for payment source | Admitting: Family Medicine

## 2022-02-13 ENCOUNTER — Emergency Department: Payer: No Typology Code available for payment source

## 2022-02-13 ENCOUNTER — Other Ambulatory Visit: Payer: Self-pay

## 2022-02-13 ENCOUNTER — Emergency Department
Admission: EM | Admit: 2022-02-13 | Discharge: 2022-02-13 | Disposition: A | Discharge status: Home / Self Care | Payer: No Typology Code available for payment source | Attending: Emergency Medicine | Admitting: Emergency Medicine

## 2022-02-13 DIAGNOSIS — Y92410 Unspecified street and highway as the place of occurrence of the external cause: Secondary | ICD-10-CM | POA: Diagnosis not present

## 2022-02-13 DIAGNOSIS — Z23 Encounter for immunization: Secondary | ICD-10-CM | POA: Diagnosis not present

## 2022-02-13 DIAGNOSIS — S70211A Abrasion, right hip, initial encounter: Secondary | ICD-10-CM | POA: Diagnosis not present

## 2022-02-13 DIAGNOSIS — R21 Rash and other nonspecific skin eruption: Secondary | ICD-10-CM | POA: Insufficient documentation

## 2022-02-13 DIAGNOSIS — S4991XA Unspecified injury of right shoulder and upper arm, initial encounter: Secondary | ICD-10-CM | POA: Diagnosis present

## 2022-02-13 DIAGNOSIS — T07XXXA Unspecified multiple injuries, initial encounter: Secondary | ICD-10-CM

## 2022-02-13 DIAGNOSIS — S40011A Contusion of right shoulder, initial encounter: Secondary | ICD-10-CM | POA: Diagnosis not present

## 2022-02-13 LAB — POC URINE PREG, ED: Preg Test, Ur: NEGATIVE

## 2022-02-13 MED ORDER — TRAMADOL HCL 50 MG PO TABS
50.0000 mg | ORAL_TABLET | Freq: Once | ORAL | Status: AC
  Administered 2022-02-13: 50 mg via ORAL
  Filled 2022-02-13: qty 1

## 2022-02-13 MED ORDER — NAPROXEN 500 MG PO TABS: 500.0000 mg | ORAL_TABLET | Freq: Two times a day (BID) | ORAL | 2 refills | Status: DC

## 2022-02-13 MED ORDER — MUPIROCIN 2 % EX OINT: 1.0000 "application " | TOPICAL_OINTMENT | Freq: Two times a day (BID) | CUTANEOUS | 0 refills | Status: DC

## 2022-02-13 MED ORDER — NAPROXEN 500 MG PO TABS
500.0000 mg | ORAL_TABLET | Freq: Two times a day (BID) | ORAL | 2 refills | Status: DC
Start: 2022-02-13 — End: 2022-02-14

## 2022-02-13 MED ORDER — TETANUS-DIPHTH-ACELL PERTUSSIS 5-2.5-18.5 LF-MCG/0.5 IM SUSY
0.5000 mL | PREFILLED_SYRINGE | Freq: Once | INTRAMUSCULAR | Status: AC
  Administered 2022-02-13: 0.5 mL via INTRAMUSCULAR
  Filled 2022-02-13: qty 0.5

## 2022-02-13 MED ORDER — CEPHALEXIN 500 MG PO CAPS: 500.0000 mg | ORAL_CAPSULE | Freq: Two times a day (BID) | ORAL | 0 refills | Status: DC
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
## 2022-02-13 NOTE — ED Notes (Signed)
This RN cleaned and dressed pt bilateral arm wounds, applied non adherent pads to wounds and wrapped with gauze.  Pt able to flex and extend both arms. ?
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
## 2022-02-14 ENCOUNTER — Ambulatory Visit (INDEPENDENT_AMBULATORY_CARE_PROVIDER_SITE_OTHER): Payer: No Typology Code available for payment source | Admitting: Family Medicine

## 2022-02-14 ENCOUNTER — Encounter: Payer: Self-pay | Admitting: Family Medicine

## 2022-02-14 ENCOUNTER — Ambulatory Visit: Payer: Self-pay

## 2022-02-14 VITALS — BP 116/78 | HR 100 | Temp 98.1°F | Resp 16 | Ht 65.0 in | Wt 204.0 lb

## 2022-02-14 DIAGNOSIS — M542 Cervicalgia: Secondary | ICD-10-CM

## 2022-02-14 DIAGNOSIS — T07XXXA Unspecified multiple injuries, initial encounter: Secondary | ICD-10-CM | POA: Diagnosis not present

## 2022-02-14 DIAGNOSIS — M549 Dorsalgia, unspecified: Secondary | ICD-10-CM | POA: Diagnosis not present

## 2022-02-14 DIAGNOSIS — S40011D Contusion of right shoulder, subsequent encounter: Secondary | ICD-10-CM | POA: Diagnosis not present

## 2022-02-14 MED ORDER — BACLOFEN 5 MG PO TABS: 5.0000 mg | ORAL_TABLET | Freq: Three times a day (TID) | ORAL | 0 refills | Status: DC | PRN

## 2022-02-14 MED ORDER — TRAMADOL HCL 50 MG PO TABS: ORAL_TABLET | ORAL | 0 refills | Status: DC
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
## 2022-02-16 ENCOUNTER — Encounter: Payer: Self-pay | Admitting: Family Medicine

## 2022-02-18 ENCOUNTER — Telehealth: Payer: Self-pay | Admitting: Family Medicine

## 2022-02-18 ENCOUNTER — Other Ambulatory Visit: Payer: Self-pay | Admitting: Family Medicine

## 2022-02-18 DIAGNOSIS — M549 Dorsalgia, unspecified: Secondary | ICD-10-CM

## 2022-02-18 DIAGNOSIS — M542 Cervicalgia: Secondary | ICD-10-CM
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
## 2022-02-19 ENCOUNTER — Encounter: Payer: Self-pay | Admitting: Emergency Medicine

## 2022-02-19 ENCOUNTER — Ambulatory Visit
Admission: EM | Admit: 2022-02-19 | Discharge: 2022-02-19 | Disposition: A | Discharge status: Home / Self Care | Payer: No Typology Code available for payment source | Attending: Physician Assistant | Admitting: Physician Assistant

## 2022-02-19 ENCOUNTER — Other Ambulatory Visit: Payer: Self-pay

## 2022-02-19 ENCOUNTER — Ambulatory Visit (INDEPENDENT_AMBULATORY_CARE_PROVIDER_SITE_OTHER): Payer: No Typology Code available for payment source

## 2022-02-19 DIAGNOSIS — M25532 Pain in left wrist: Secondary | ICD-10-CM

## 2022-02-19 DIAGNOSIS — T07XXXA Unspecified multiple injuries, initial encounter: Secondary | ICD-10-CM

## 2022-02-19 DIAGNOSIS — M79642 Pain in left hand: Secondary | ICD-10-CM

## 2022-02-19 MED ORDER — MUPIROCIN 2 % EX OINT: 1.0000 "application " | TOPICAL_OINTMENT | Freq: Two times a day (BID) | CUTANEOUS | 1 refills | Status: AC

## 2022-02-19 MED ORDER — TRAMADOL HCL 50 MG PO TABS: 50.0000 mg | ORAL_TABLET | Freq: Four times a day (QID) | ORAL | 0 refills | Status: AC | PRN
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
## 2022-02-19 NOTE — Discharge Instructions (Addendum)
-  X-rays are negative for fractures ?- Continue wound care guidelines as directed. ?- I sent more ointment for you.  Continue the antibiotics ?- Continue taking medications for pain as needed. ?- Follow-up for any worsening symptoms and ED or with PCP if not improving. ?
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
## 2022-03-20 ENCOUNTER — Encounter: Payer: Self-pay | Admitting: Family Medicine

## 2022-03-22 ENCOUNTER — Telehealth (INDEPENDENT_AMBULATORY_CARE_PROVIDER_SITE_OTHER): Payer: No Typology Code available for payment source | Admitting: Family Medicine

## 2022-03-22 ENCOUNTER — Other Ambulatory Visit: Payer: Self-pay

## 2022-03-22 ENCOUNTER — Encounter: Payer: Self-pay | Admitting: Family Medicine

## 2022-03-22 DIAGNOSIS — M25532 Pain in left wrist: Secondary | ICD-10-CM | POA: Diagnosis not present

## 2022-03-22 DIAGNOSIS — N926 Irregular menstruation, unspecified: Secondary | ICD-10-CM

## 2022-03-22 DIAGNOSIS — R21 Rash and other nonspecific skin eruption: Secondary | ICD-10-CM

## 2022-03-22 MED ORDER — MELOXICAM 15 MG PO TABS
15.0000 mg | ORAL_TABLET | Freq: Every day | ORAL | 1 refills | Status: AC
  Filled 2022-03-22: qty 90, 90d supply, fill #0
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
## 2022-12-09 DIAGNOSIS — Z3046 Encounter for surveillance of implantable subdermal contraceptive: Secondary | ICD-10-CM | POA: Diagnosis not present

## 2023-01-23 ENCOUNTER — Other Ambulatory Visit: Payer: Self-pay

## 2023-01-23 MED ORDER — AMOXICILLIN 500 MG PO CAPS
500.0000 mg | ORAL_CAPSULE | Freq: Three times a day (TID) | ORAL | 0 refills | Status: AC
  Filled 2023-01-23: qty 21, 7d supply, fill #0

## 2023-05-22 DIAGNOSIS — Z1329 Encounter for screening for other suspected endocrine disorder: Secondary | ICD-10-CM | POA: Diagnosis not present

## 2023-05-22 DIAGNOSIS — Z131 Encounter for screening for diabetes mellitus: Secondary | ICD-10-CM | POA: Diagnosis not present

## 2023-05-22 DIAGNOSIS — R519 Headache, unspecified: Secondary | ICD-10-CM | POA: Diagnosis not present

## 2023-05-22 DIAGNOSIS — Z130 Encounter for screening for diseases of the blood and blood-forming organs and certain disorders involving the immune mechanism: Secondary | ICD-10-CM | POA: Diagnosis not present

## 2023-05-22 DIAGNOSIS — Z124 Encounter for screening for malignant neoplasm of cervix: Secondary | ICD-10-CM | POA: Diagnosis not present

## 2023-05-22 DIAGNOSIS — Z1331 Encounter for screening for depression: Secondary | ICD-10-CM | POA: Diagnosis not present

## 2023-05-22 DIAGNOSIS — Z13220 Encounter for screening for lipoid disorders: Secondary | ICD-10-CM | POA: Diagnosis not present

## 2023-05-22 DIAGNOSIS — Z01411 Encounter for gynecological examination (general) (routine) with abnormal findings: Secondary | ICD-10-CM | POA: Diagnosis not present

## 2023-05-24 ENCOUNTER — Ambulatory Visit: Payer: Self-pay | Admitting: *Deleted

## 2023-05-24 NOTE — Telephone Encounter (Signed)
  Chief Complaint: Insect bite Symptoms: "Maybe spider bite" yesterday. Right ankle. Spreading redness, warmth, itching, painful. Swelling "Twice the size of ankle."  "Two white heads close together at bite."  Frequency: Yesterday Pertinent Negatives: Patient denies SOB Disposition: [] ED /[x] Urgent Care (no appt availability in office) / [] Appointment(In office/virtual)/ []  [facility]/ [] Home Care/ [] Refused Recommended Disposition /[] [facility]/ []  Follow-up with PCP Additional Notes: No availability at practice, called for consult, Rhonda. Advised UC, pt states will follow disposition. Care advise provided. Reason for Disposition  [1] Red streak or red line AND [2] length > 2 inches (5 cm)  Answer Assessment - Initial Assessment Questions 1. TYPE of INSECT: "What type of insect was it?"      Unsure, possible spider 2. ONSET: "When did you get bitten?"      Yesterday 3. LOCATION: "Where is the insect bite located?"      Right ankle 4. REDNESS: "Is the area red or pink?" If Yes, ask: "What size is area of redness?" (inches or cm). "When did the redness start?"     Redness, warmth 5. PAIN: "Is there any pain?" If Yes, ask: "How bad is it?"  (Scale 1-10; or mild, moderate, severe)     Tender to touch 6. ITCHING: "Does it itch?" If Yes, ask: "How bad is the itch?"    - MILD: doesn't interfere with normal activities   - MODERATE-SEVERE: interferes with work, school, sleep, or other activities      Yes 7. SWELLING: "How big is the swelling?" (inches, cm, or compare to coins)     Twice the size 8. OTHER SYMPTOMS: "Do you have any other symptoms?"  (e.g., difficulty breathing, hives)     2 small white heads close together  Protocols used: Insect Bite-A-AH

## 2023-05-25 ENCOUNTER — Emergency Department
Admission: EM | Admit: 2023-05-25 | Discharge: 2023-05-25 | Disposition: A | Discharge status: Home / Self Care | Payer: 59 | Attending: Emergency Medicine | Admitting: Emergency Medicine

## 2023-05-25 ENCOUNTER — Other Ambulatory Visit: Payer: Self-pay

## 2023-05-25 ENCOUNTER — Encounter: Payer: Self-pay | Admitting: Emergency Medicine

## 2023-05-25 DIAGNOSIS — L03115 Cellulitis of right lower limb: Secondary | ICD-10-CM | POA: Diagnosis not present

## 2023-05-25 DIAGNOSIS — R2241 Localized swelling, mass and lump, right lower limb: Secondary | ICD-10-CM | POA: Diagnosis present

## 2023-05-25 MED ORDER — CLINDAMYCIN HCL 300 MG PO CAPS
300.0000 mg | ORAL_CAPSULE | Freq: Three times a day (TID) | ORAL | 0 refills | Status: AC
  Filled 2023-05-25: qty 30, 10d supply, fill #0

## 2023-05-25 MED ORDER — CLINDAMYCIN HCL 150 MG PO CAPS
300.0000 mg | ORAL_CAPSULE | Freq: Once | ORAL | Status: AC
  Administered 2023-05-25: 300 mg via ORAL
  Filled 2023-05-25: qty 2

## 2023-05-25 NOTE — ED Provider Notes (Signed)
   [facility] Provider Note    Event Date/Time   First MD Initiated Contact with Patient 05/25/23 1114     (approximate)  History   Chief Complaint: Insect Bite  HPI  Deanna Bryant is a 22 y.o. female with a past medical history of anxiety presents to the emergency department for redness and discomfort to her right lower extremity.  According to the patient 2 days ago she noticed a small pustule to the right lower extremity with a small amount of redness surrounding it.  Over the last 2 days that redness has increased has been tender and warm to the touch.  No systemic fever.  Patient thinks she may have got bitten by a spider but denies seeing any insect bite her.  Physical Exam   Triage Vital Signs: ED Triage Vitals  Encounter Vitals Group     BP 05/25/23 1101 107/69     Systolic BP Percentile --      Diastolic BP Percentile --      Pulse Rate 05/25/23 1101 96     Resp 05/25/23 1101 16     Temp 05/25/23 1101 98.2 F (36.8 C)     Temp Source 05/25/23 1101 Oral     SpO2 05/25/23 1101 99 %     Weight 05/25/23 1056 185 lb (83.9 kg)     Height 05/25/23 1056 5\' 5"  (1.651 m)     Head Circumference --      Peak Flow --      Pain Score 05/25/23 1056 6     Pain Loc --      Pain Education --      Exclude from Growth Chart --     Most recent vital signs: Vitals:   05/25/23 1101 05/25/23 1105  BP: 107/69 113/79  Pulse: 96 93  Resp: 16 14  Temp: 98.2 F (36.8 C) 98.5 F (36.9 C)  SpO2: 99% 99%    General: Awake, no distress.  CV:  Good peripheral perfusion.  Resp:  Normal effort.  Abd:  No distention.   Other:  2 small pustules to the right lower extremity with surrounding erythema approximately 10 cm in diameter.  Warm to the touch mildly tender especially over the pustules.  No sign of abscess.   ED Results / Procedures / Treatments   MEDICATIONS ORDERED IN ED: Medications  clindamycin (CLEOCIN) capsule 300 mg (has no administration in time  range)     IMPRESSION / MDM / ASSESSMENT AND PLAN / ED COURSE  I reviewed the triage vital signs and the nursing notes.  Patient's presentation is most consistent with acute presentation with potential threat to life or bodily function.  Patient presents emergency department for signs of cellulitis to the right lower extremity with 2 small pustules.  I have cleaned the 2 small pustules with alcohol and unroofed them with a blunt tipped needle.  We will dose a dose of oral clindamycin and discharged on clindamycin 3 times daily x 10 days.  Discussed with the patient return precautions for any worsening redness pain swelling or fever..  Patient agreeable to plan of care.  FINAL CLINICAL IMPRESSION(S) / ED DIAGNOSES   Cellulitis  Rx / DC Orders   Clindamycin  Note:  This document was prepared using Dragon voice recognition software and may include unintentional dictation errors.   Minna Antis, MD 05/25/23 1131

## 2023-05-25 NOTE — ED Triage Notes (Signed)
Pt via POV from home. Pt c/o spider bite on the R lower leg on 8/13. States that she had EMS come look at it but the redness, swelling, and pain is getting worse. Pt is A&Ox4 and NAD

## 2023-07-19 ENCOUNTER — Other Ambulatory Visit: Payer: Self-pay

## 2023-07-19 DIAGNOSIS — R110 Nausea: Secondary | ICD-10-CM | POA: Diagnosis not present

## 2023-07-19 DIAGNOSIS — Z87828 Personal history of other (healed) physical injury and trauma: Secondary | ICD-10-CM | POA: Diagnosis not present

## 2023-07-19 DIAGNOSIS — R519 Headache, unspecified: Secondary | ICD-10-CM | POA: Diagnosis not present

## 2023-07-19 DIAGNOSIS — H53149 Visual discomfort, unspecified: Secondary | ICD-10-CM | POA: Diagnosis not present

## 2023-07-19 DIAGNOSIS — R42 Dizziness and giddiness: Secondary | ICD-10-CM | POA: Diagnosis not present

## 2023-07-19 MED ORDER — NORTRIPTYLINE HCL 10 MG PO CAPS
ORAL_CAPSULE | ORAL | 3 refills | Status: DC
  Filled 2023-07-19: qty 60, 27d supply, fill #0

## 2023-07-19 MED ORDER — RIZATRIPTAN BENZOATE 10 MG PO TABS
ORAL_TABLET | ORAL | 2 refills | Status: DC
  Filled 2023-07-19: qty 9, 15d supply, fill #0

## 2023-07-20 ENCOUNTER — Other Ambulatory Visit: Payer: Self-pay | Admitting: Physician Assistant

## 2023-07-20 DIAGNOSIS — Z87828 Personal history of other (healed) physical injury and trauma: Secondary | ICD-10-CM

## 2023-07-20 DIAGNOSIS — R42 Dizziness and giddiness: Secondary | ICD-10-CM

## 2023-07-20 DIAGNOSIS — R519 Headache, unspecified: Secondary | ICD-10-CM

## 2023-07-20 DIAGNOSIS — H53149 Visual discomfort, unspecified: Secondary | ICD-10-CM

## 2023-10-14 ENCOUNTER — Encounter: Payer: Self-pay | Admitting: Emergency Medicine

## 2023-10-14 ENCOUNTER — Ambulatory Visit
Admission: EM | Admit: 2023-10-14 | Discharge: 2023-10-14 | Disposition: A | Discharge status: Home / Self Care | Payer: BC Managed Care – PPO | Attending: Physician Assistant | Admitting: Physician Assistant

## 2023-10-14 DIAGNOSIS — R21 Rash and other nonspecific skin eruption: Secondary | ICD-10-CM

## 2023-10-14 MED ORDER — PREDNISONE 10 MG PO TABS: ORAL_TABLET | ORAL | 0 refills | Status: AC

## 2023-10-14 MED ORDER — CLOTRIMAZOLE-BETAMETHASONE 1-0.05 % EX CREA: TOPICAL_CREAM | CUTANEOUS | 0 refills | Status: AC

## 2023-10-14 NOTE — ED Provider Notes (Signed)
 [facility]    CSN: [id number] Arrival date & time: 10/14/23  1414      History   Chief Complaint Chief Complaint  Patient presents with   Rash    HPI Deanna Bryant is a 23 y.o. female presenting for rash of left thigh around her tattoo.  She had the tattoo done in October and in November.  Reports that after the tattoo was completely healed she developed a rash.  Patient says she has other tattoos and has never had a rash like this regarding ink.  She does report that the tattoo was wrapped in Saran wrap afterwards and does not know if that could have caused the rash.  She was given 3 tablets of prednisone  in the past and that helped.  However over the past couple weeks the rash seems to have come back.  It is pruritic but not painful.  No drainage or bleeding.  No fever.  Has not tried any topicals.  HPI  Past Medical History:  Diagnosis Date   Anxiety    Depression    Vaccine for human papilloma virus (HPV) types 6, 11, 16, and 18 administered     Patient Active Problem List   Diagnosis Date Noted   Dyspareunia in female 12/29/2020   Vulvodynia 12/29/2020   Anxiety and depression 05/14/2019    Past Surgical History:  Procedure Laterality Date   HAND SURGERY     TRIGGER FINGER RELEASE      OB History     Gravida  0   Para  0   Term  0   Preterm  0   AB  0   Living  0      SAB  0   IAB  0   Ectopic  0   Multiple  0   Live Births  0            Home Medications    Prior to Admission medications   Medication Sig Start Date End Date Taking? Authorizing Provider  clotrimazole -betamethasone  (LOTRISONE ) cream Apply to affected area 2 times daily prn 10/14/23 10/21/23 Yes Arvis Jolan NOVAK, PA-C  predniSONE  (DELTASONE ) 10 MG tablet Take 6 tabs p.o. on day 1 and decrease by 1 tablet daily until complete. 10/14/23  Yes Arvis Jolan NOVAK, PA-C  amoxicillin  (AMOXIL ) 500 MG capsule Take 1 capsule (500 mg total) by mouth every 8 (eight) hours until  all are taken. 01/23/23     etonogestrel  (NEXPLANON ) 68 MG IMPL implant 1 each by Subdermal route once. Early 2021 approx insertion date    [provider]  meloxicam  (MOBIC ) 15 MG tablet Take 1 tablet (15 mg total) by mouth daily. 03/22/22   Tapia, Leisa, PA-C  mupirocin  ointment (BACTROBAN ) 2 % Apply 1 application. topically 2 (two) times daily. Patient not taking: Reported on 03/22/2022 02/19/22   Arvis Jolan NOVAK, PA-C  nortriptyline  (PAMELOR ) 10 MG capsule Take 1 capsule (10 mg) by mouth each night for one week, then increase to 2 capsules (20 mg) at night for headache prevention. 07/19/23 07/19/23    rizatriptan  (MAXALT ) 10 MG tablet Take 1 tablet (10 mg total) by mouth as directed for migraine. May take a second dose after 2 hours if needed. 07/19/23 07/19/23      Family History Family History  Problem Relation Age of Onset   Diabetes Mother    Thyroid  cancer Mother    Diabetes Father    Diabetes Maternal Grandfather     Social  History Social History   Tobacco Use   Smoking status: Former    Types: E-cigarettes   Smokeless tobacco: Never  Vaping Use   Vaping status: Former  Substance Use Topics   Alcohol use: No   Drug use: No     Allergies   Patient has no known allergies.   Review of Systems Review of Systems  Constitutional:  Negative for fatigue and fever.  Musculoskeletal:  Negative for arthralgias and joint swelling.  Skin:  Positive for rash.     Physical Exam Triage Vital Signs ED Triage Vitals  Encounter Vitals Group     BP 10/14/23 1448 109/76     Systolic BP Percentile --      Diastolic BP Percentile --      Pulse Rate 10/14/23 1448 94     Resp 10/14/23 1448 14     Temp 10/14/23 1448 98.3 F (36.8 C)     Temp Source 10/14/23 1448 Oral     SpO2 10/14/23 1448 100 %     Weight 10/14/23 1447 184 lb 15.5 oz (83.9 kg)     Height 10/14/23 1447 5' 5 (1.651 m)     Head Circumference --      Peak Flow --      Pain Score 10/14/23 1447 0     Pain  Loc --      Pain Education --      Exclude from Growth Chart --    No data found.  Updated Vital Signs BP 109/76 (BP Location: Left Arm)   Pulse 94   Temp 98.3 F (36.8 C) (Oral)   Resp 14   Ht 5' 5 (1.651 m)   Wt 184 lb 15.5 oz (83.9 kg)   SpO2 100%   BMI 30.78 kg/m      Physical Exam Vitals and nursing note reviewed.  Constitutional:      General: She is not in acute distress.    Appearance: Normal appearance. She is not ill-appearing or toxic-appearing.  HENT:     Head: Normocephalic and atraumatic.  Eyes:     General: No scleral icterus.       Right eye: No discharge.        Left eye: No discharge.     Conjunctiva/sclera: Conjunctivae normal.  Cardiovascular:     Rate and Rhythm: Normal rate and regular rhythm.     Heart sounds: Normal heart sounds.  Pulmonary:     Effort: Pulmonary effort is normal. No respiratory distress.     Breath sounds: Normal breath sounds.  Musculoskeletal:     Cervical back: Neck supple.  Skin:    General: Skin is dry.     Findings: Rash (erythematous dry patchy rash on left thigh diffusely where tattoo is present) present.  Neurological:     General: No focal deficit present.     Mental Status: She is alert. Mental status is at baseline.     Motor: No weakness.     Gait: Gait normal.  Psychiatric:        Mood and Affect: Mood normal.        Behavior: Behavior normal.         UC Treatments / Results  Labs (all labs ordered are listed, but only abnormal results are displayed) Labs Reviewed - No data to display  EKG   Radiology No results found.  Procedures Procedures (including critical care time)  Medications Ordered in UC Medications - No data to display  Initial  Impression / Assessment and Plan / UC Course  I have reviewed the triage vital signs and the nursing notes.  Pertinent labs & imaging results that were available during my care of the patient were reviewed by me and considered in my medical  decision making (see chart for details).   23 year old female presents for rash affecting tattoo of left thigh.  Tattoo was done in October and November.  Rash developed after tattoo fully healed.  Took prednisone  and it seemed to help initially.  Rash has returned after a couple weeks.  Not using any over-the-counter medicines at this time.  Images have been included in chart.  Possible contact dermatitis versus fungal infection.  Treating at this time with prednisone  taper and Lotrisone .  Encouraged use of antihistamines.  Reviewed return precautions.   Final Clinical Impressions(s) / UC Diagnoses   Final diagnoses:  Rash and nonspecific skin eruption     Discharge Instructions      -I am not 100% sure as to the cause of your rash.  This prednisone  may have been helpful in the past lets try a longer course and also topical corticosteroid/antifungal medicine. - If this is persistent and follow-up with PCP as you may need alternate treatment.   ED Prescriptions     Medication Sig Dispense Auth. Provider   clotrimazole -betamethasone  (LOTRISONE ) cream Apply to affected area 2 times daily prn 45 g Bana Borgmeyer B, PA-C   predniSONE  (DELTASONE ) 10 MG tablet Take 6 tabs p.o. on day 1 and decrease by 1 tablet daily until complete. 21 tablet Arvis Jolan NOVAK, PA-C      PDMP not reviewed this encounter.   Arvis Jolan NOVAK, PA-C 10/14/23 1526

## 2023-10-14 NOTE — ED Triage Notes (Signed)
 Patient reports red rash on her left thigh that started 2 weeks ago.  Patient states that she took Prednisone but once she finished it the rash came back.  Patient reports some itching.

## 2023-10-14 NOTE — Discharge Instructions (Signed)
-  I am not 100% sure as to the cause of your rash.  This prednisone  may have been helpful in the past lets try a longer course and also topical corticosteroid/antifungal medicine. - If this is persistent and follow-up with PCP as you may need alternate treatment.

## 2024-03-28 ENCOUNTER — Ambulatory Visit: Payer: Self-pay

## 2024-03-28 NOTE — Telephone Encounter (Signed)
 FYI Only or Action Required?: FYI only for provider.  Patient was last seen in primary care on na Called Nurse Triage reporting Leg Pain. Symptoms began several weeks ago. Interventions attempted: Nothing. Symptoms are: gradually worsening.  Triage Disposition: See PCP When Office is Open (Within 3 Days)  Patient/caregiver understands and will follow disposition?: Yes     Copied from CRM [id number]. Topic: Clinical - Red Word Triage >> Mar 28, 2024 12:59 PM Tiffini S wrote: Red Word that prompted transfer to Nurse Triage: Patient had a tattoo done on back of leg/ bend of the leg on 03/12/24. Area is very red and possibly infected. Reason for Disposition  [1] MODERATE pain (e.g., interferes with normal activities, limping) AND [2] present > 3 days  Answer Assessment - Initial Assessment Questions 1. ONSET: When did the pain start?      03/12/24 got a tattoo done on the back of left, tattoo on ditch of knee 2. LOCATION: Where is the pain located?      Ditch of knee 3. PAIN: How bad is the pain?    (Scale 1-10; or mild, moderate, severe)   -  MILD (1-3): doesn't interfere with normal activities    -  MODERATE (4-7): interferes with normal activities (e.g., work or school) or awakens from sleep, limping    -  SEVERE (8-10): excruciating pain, unable to do any normal activities, unable to walk     States doesn't hurt, it's scabbed up 4. WORK OR EXERCISE: Has there been any recent work or exercise that involved this part of the body?      Bending knee hurts 5. CAUSE: What do you think is causing the leg pain?     tattoo 6. OTHER SYMPTOMS: Do you have any other symptoms? (e.g., chest pain, back pain, breathing difficulty, swelling, rash, fever, numbness, weakness)     swelling 7. PREGNANCY: Is there any chance you are pregnant? When was your last menstrual period?     na  Protocols used: Leg Pain-A-AH

## 2024-05-14 ENCOUNTER — Emergency Department

## 2024-05-14 ENCOUNTER — Emergency Department
Admission: EM | Admit: 2024-05-14 | Discharge: 2024-05-14 | Disposition: A | Discharge status: Home / Self Care | Attending: Emergency Medicine | Admitting: Emergency Medicine

## 2024-05-14 ENCOUNTER — Other Ambulatory Visit: Payer: Self-pay

## 2024-05-14 DIAGNOSIS — N939 Abnormal uterine and vaginal bleeding, unspecified: Secondary | ICD-10-CM | POA: Diagnosis present

## 2024-05-14 LAB — BASIC METABOLIC PANEL WITH GFR
Anion gap: 6 (ref 5–15)
BUN: 10 mg/dL (ref 6–20)
CO2: 26 mmol/L (ref 22–32)
Calcium: 9.2 mg/dL (ref 8.9–10.3)
Chloride: 104 mmol/L (ref 98–111)
Creatinine, Ser: 0.63 mg/dL (ref 0.44–1.00)
GFR, Estimated: >60 mL/min (ref >60)
Glucose, Bld: 90 mg/dL (ref 70–99)
Potassium: 3.9 mmol/L (ref 3.5–5.1)
Sodium: 136 mmol/L (ref 135–145)

## 2024-05-14 LAB — CBC WITH DIFFERENTIAL/PLATELET
Abs Immature Granulocytes: 0.02 K/uL (ref 0.00–0.07)
Basophils Absolute: 0.1 K/uL (ref 0.0–0.1)
Basophils Relative: 1 %
Eosinophils Absolute: 0.1 K/uL (ref 0.0–0.5)
Eosinophils Relative: 1 %
HCT: 38.2 % (ref 36.0–46.0)
Hemoglobin: 13 g/dL (ref 12.0–15.0)
Immature Granulocytes: 0 %
Lymphocytes Relative: 27 %
Lymphs Abs: 1.7 K/uL (ref 0.7–4.0)
MCH: 29.3 pg (ref 26.0–34.0)
MCHC: 34 g/dL (ref 30.0–36.0)
MCV: 86.2 fL (ref 80.0–100.0)
Monocytes Absolute: 0.5 K/uL (ref 0.1–1.0)
Monocytes Relative: 8 %
Neutro Abs: 4.1 K/uL (ref 1.7–7.7)
Neutrophils Relative %: 63 %
Platelets: 234 K/uL (ref 150–400)
RBC: 4.43 MIL/uL (ref 3.87–5.11)
RDW: 12 % (ref 11.5–15.5)
WBC: 6.4 K/uL (ref 4.0–10.5)
nRBC: 0 % (ref 0.0–0.2)

## 2024-05-14 LAB — URINALYSIS, ROUTINE W REFLEX MICROSCOPIC
Bilirubin Urine: NEGATIVE
Glucose, UA: NEGATIVE mg/dL
Ketones, ur: NEGATIVE mg/dL
Leukocytes,Ua: NEGATIVE
Nitrite: NEGATIVE
Protein, ur: NEGATIVE mg/dL
Specific Gravity, Urine: 1.01 (ref 1.005–1.030)
pH: 7 (ref 5.0–8.0)

## 2024-05-14 LAB — POC URINE PREG, ED: Preg Test, Ur: NEGATIVE

## 2024-05-14 MED ORDER — TRANEXAMIC ACID 650 MG PO TABS: 1300.0000 mg | ORAL_TABLET | Freq: Three times a day (TID) | ORAL | 0 refills | Status: AC

## 2024-05-14 NOTE — ED Notes (Signed)
 See triage note  Presents with some vaginal bleeding   States she developed some vaginal bleeding about 1 month ago  Now having cramping

## 2024-05-14 NOTE — Discharge Instructions (Signed)
 You were evaluated in the ED for vaginal bleeding.  Your lab work is reassuring.  Your ultrasound reveals a right ovarian cyst which we do not believe is contributing to your vaginal bleeding.  Advise repeat ultrasound in 8 weeks.  Follow-up with your OB/GYN to discuss possible Nexplanon  removal in 3 days.  In the interim medication has been sent to your pharmacy.  Please take as instructed.

## 2024-05-14 NOTE — ED Triage Notes (Signed)
 Pt reports having vaginal bleeding for 1 month now while having a nexplanon  placed. Pt had the nexplanon  placed last year in March with no issues until now. Pt is having abdominal cramping and back pain as well.

## 2024-05-14 NOTE — ED Provider Notes (Signed)
 [facility] Emergency Department Provider Note     Event Date/Time   First MD Initiated Contact with Patient 05/14/24 1538     (approximate)   History   Vaginal Bleeding   HPI  Deanna Bryant is a 23 y.o. female with no significant past medical history presents to the ED for persistent vaginal bleeding x 1 month with associated intermittent abdominal cramping.  Patient reports she soaks through a super tampon in 45 minutes daily. Reports noticed blood clots. She had a Nexplanon  placed in 2024. She has had nexplanon  insertions in the past with no side effects.  Patient [name] for [facility]N care and tried NSAIDs 600 mg for 7 days with no improvement.  No other complaints.    Physical Exam   Triage Vital Signs: ED Triage Vitals  Encounter Vitals Group     BP 05/14/24 1346 117/71     Girls Systolic BP Percentile --      Girls Diastolic BP Percentile --      Boys Systolic BP Percentile --      Boys Diastolic BP Percentile --      Pulse Rate 05/14/24 1346 86     Resp 05/14/24 1346 16     Temp 05/14/24 1346 98.6 F (37 C)     Temp Source 05/14/24 1346 Oral     SpO2 05/14/24 1346 100 %     Weight 05/14/24 1347 170 lb (77.1 kg)     Height 05/14/24 1347 5' 5 (1.651 m)     Head Circumference --      Peak Flow --      Pain Score 05/14/24 1347 3     Pain Loc --      Pain Education --      Exclude from Growth Chart --     Most recent vital signs: Vitals:   05/14/24 1346 05/14/24 1857  BP: 117/71 113/79  Pulse: 86 75  Resp: 16 18  Temp: 98.6 F (37 C) 98.5 F (36.9 C)  SpO2: 100% 100%    General Awake, no distress.  HEENT NCAT.  CV:  Good peripheral perfusion.  RESP:  Normal effort.  ABD:  No distention.  Soft, nontender.  Mild tenderness to suprapubic region with palpation. Other:  Pelvic exam performed.  Normal external genitalia.  No bleeding in the vaginal vault noticed however there is a small amount seeping through cervix.   Otherwise normal.  Moderate tenderness with speculum insertion.    ED Results / Procedures / Treatments   Labs (all labs ordered are listed, but only abnormal results are displayed) Labs Reviewed  URINALYSIS, ROUTINE W REFLEX MICROSCOPIC - Abnormal; Notable for the following components:      Result Value   Color, Urine YELLOW (*)    APPearance CLEAR (*)    Hgb urine dipstick LARGE (*)    Bacteria, UA RARE (*)    All other components within normal limits  CBC WITH DIFFERENTIAL/PLATELET  BASIC METABOLIC PANEL WITH GFR  POC URINE PREG, ED   RADIOLOGY  I personally viewed and evaluated these images as part of my medical decision making, as well as reviewing the written report by the radiologist.  ED Provider Interpretation: Ovarian cyst noted to right ovary  US  PELVIC COMPLETE WITH TRANSVAGINAL Result Date: 05/14/2024 EXAM: US  Pelvis, Complete Transvaginal and Transabdominal without Doppler TECHNIQUE: Transabdominal and transvaginal pelvic duplex ultrasound using B-mode/gray scaled imaging without Doppler spectral analysis and color flow was obtained. COMPARISON:  02/12/2021 CLINICAL HISTORY: Vaginal bleeding FINDINGS: UTERUS: Uterus anteverted, measures 7 x 4.1 x 4.5 cm (67 cc). Uterus demonstrates normal myometrial echotexture. ENDOMETRIAL STRIPE: Endometrial measures 5 mm. Endometrial stripe is within normal limits. RIGHT OVARY: Right ovary measures 4.5 x 2.8 x 3.8 cm (24 cc). 3.6 x 2.2 x 2.2 cm minimally complex ovarian cyst. There is normal arterial and venous Doppler flow. LEFT OVARY: Left ovary not visualized. FREE FLUID: No free fluid. The technologist describes the study as technically limited secondary to patient pain. IMPRESSION: 1. Minimally complex ovarian cyst, measuring 3.6 x 2.2 x 2.2 cm. According to the Ovarian-Adnexal Reporting and Data System Ultrasound (O-RADS US ), the finding is consistent with O-RADS US  2 (almost certainly benign) and the recommendation is: for a minimally  complex (non-simple but unilocular) ovarian cyst with smooth margins measuring 3.6x2.2x2.2 cm in a premenopausal patient, 8-12 week follow-up is recommended. 2. Left ovary not visualized. 3. Technically limited study secondary to patient pain. Electronically signed by: Katheleen Faes MD 05/14/2024 06:33 PM EDT RP Workstation: [id number]    PROCEDURES:  Critical Care performed: No  Procedures   MEDICATIONS ORDERED IN ED: Medications - No data to display   IMPRESSION / MDM / ASSESSMENT AND PLAN / ED COURSE  I reviewed the triage vital signs and the nursing notes.                              Clinical Course as of 05/14/24 1923  Tue May 14, 2024  8143 D/w Edsel Blush, CNM of management with TXA for 5 days. Agrees with this care plan and close OBGYN follow up  [MH]  1920 US  PELVIC COMPLETE WITH TRANSVAGINAL IMPRESSION: 1. Minimally complex ovarian cyst, measuring 3.6 x 2.2 x 2.2 cm. According to the Ovarian-Adnexal Reporting and Data System Ultrasound (O-RADS US ), the finding is consistent with O-RADS US  2 (almost certainly benign) and the recommendation is: for a minimally complex (non-simple but unilocular) ovarian cyst with smooth margins measuring 3.6x2.2x2.2 cm in a premenopausal patient, 8-12 week follow-up is recommended. 2. Left ovary not visualized. 3. Technically limited study secondary to patient pain.   [MH]    Clinical Course User Index [MH] Margrette Monte A, PA-C    23 y.o. female presents to the emergency department for evaluation and treatment of heavy vaginal bleeding x 1 month. See HPI for further details.   Differential diagnosis includes, but is not limited to menorrhagia, abnormal uterine bleeding, Nexplanon  adverse effect  Patient's presentation is most consistent with acute complicated illness / injury requiring diagnostic workup.  Patient is alert and oriented.  She is hemodynamically stable.  Well-appearing on physical exam.  In no acute distress.   Lab work is reassuring.  Hemoglobin is stable.  Urinalysis normal.  Pregnancy test negative.  Ultrasound performed limited study secondary to pain.  Recommended repeat in 8 - 12 weeks.  The patient is stable and satisfied condition for discharge home and outpatient management.  Encouraged close follow-up with OB/GYN for further management.  Prescription for TXA sent to pharmacy.  ED return precautions discussed.  FINAL CLINICAL IMPRESSION(S) / ED DIAGNOSES   Final diagnoses:  Vaginal bleeding   Rx / DC Orders   ED Discharge Orders          Ordered    tranexamic acid  (LYSTEDA ) 650 MG TABS tablet  3 times daily        05/14/24 1850  Note:  This document was prepared using Dragon voice recognition software and may include unintentional dictation errors.    Margrette, Julen Rubert A, PA-C 05/14/24 1924    Claudene Rover, MD 05/14/24 [phone]

## 2024-09-30 ENCOUNTER — Other Ambulatory Visit: Payer: Self-pay

## 2024-09-30 ENCOUNTER — Emergency Department
Admission: EM | Admit: 2024-09-30 | Discharge: 2024-10-01 | Disposition: A | Discharge status: Home / Self Care | Attending: Emergency Medicine | Admitting: Emergency Medicine

## 2024-09-30 DIAGNOSIS — G43001 Migraine without aura, not intractable, with status migrainosus: Secondary | ICD-10-CM | POA: Diagnosis present

## 2024-09-30 MED ORDER — ONDANSETRON HCL 4 MG/2ML IJ SOLN
4.0000 mg | Freq: Once | INTRAMUSCULAR | Status: AC
  Administered 2024-09-30: 4 mg via INTRAVENOUS
  Filled 2024-09-30: qty 2

## 2024-09-30 MED ORDER — KETOROLAC TROMETHAMINE 15 MG/ML IJ SOLN
15.0000 mg | Freq: Once | INTRAMUSCULAR | Status: AC
  Administered 2024-09-30: 15 mg via INTRAVENOUS
  Filled 2024-09-30: qty 1

## 2024-09-30 MED ORDER — DIPHENHYDRAMINE HCL 50 MG/ML IJ SOLN
25.0000 mg | Freq: Once | INTRAMUSCULAR | Status: AC
  Administered 2024-09-30: 25 mg via INTRAVENOUS
  Filled 2024-09-30: qty 1

## 2024-09-30 MED ORDER — SODIUM CHLORIDE 0.9 % IV BOLUS
500.0000 mL | Freq: Once | INTRAVENOUS | Status: AC
  Administered 2024-09-30: 500 mL via INTRAVENOUS

## 2024-09-30 MED ORDER — DEXAMETHASONE SOD PHOSPHATE PF 10 MG/ML IJ SOLN
10.0000 mg | Freq: Once | INTRAMUSCULAR | Status: AC
  Administered 2024-09-30: 10 mg via INTRAVENOUS

## 2024-09-30 NOTE — Discharge Instructions (Signed)
 You were evaluated in the ED for a headache and/or head injury. Your evaluation did not reveal signs of a serious condition such as a brain bleed or skull fracture and were reassuring.   Consider these rest and recovery strategies at home:  - Avoid strenous activity , exercise or heavy lifting for a few days.  -Limit screen time and activities requiring intense focus.  - Gradually return to normal activities as symptoms improve   Take tylenol  as needed.   Apply ice/cold pack to the affected area 10-20 minutes every 2-3 hours for the first 24-48 hours to reduce pain and swelling. Avoid alcohol and caffeine.   Follow up with you PCP or neurologist in 1-2 weeks if symptoms persist or worsen.

## 2024-09-30 NOTE — ED Provider Notes (Signed)
 "   [facility] Emergency Department Provider Note     Event Date/Time   First MD Initiated Contact with Patient 09/30/24 2206     (approximate)   History   Migraine   HPI  Deanna Bryant is a 23 y.o. female presents to the ED for evaluation of acute on chronic migraine with associated light sensitivity.  She has taken her propranolol and sumatriptan hand without any relief.  Denies fevers.  Endorses nausea with vomiting. Denies injury or trauma.     Physical Exam   Triage Vital Signs: ED Triage Vitals  Encounter Vitals Group     BP 09/30/24 2130 114/74     Girls Systolic BP Percentile --      Girls Diastolic BP Percentile --      Boys Systolic BP Percentile --      Boys Diastolic BP Percentile --      Pulse Rate 09/30/24 2130 81     Resp 09/30/24 2130 18     Temp 09/30/24 2130 98.8 F (37.1 C)     Temp src --      SpO2 09/30/24 2130 100 %     Weight 09/30/24 2129 185 lb (83.9 kg)     Height 09/30/24 2129 5' 6 (1.676 m)     Head Circumference --      Peak Flow --      Pain Score 09/30/24 2129 10     Pain Loc --      Pain Education --      Exclude from Growth Chart --     Most recent vital signs: Vitals:   09/30/24 2130  BP: 114/74  Pulse: 81  Resp: 18  Temp: 98.8 F (37.1 C)  SpO2: 100%   General Alert and oriented. INAD.    Head:  NCAT.  Eyes:  PERRLA. EOMI.  CV:  Good peripheral perfusion. RRR. RESP:  Normal effort. LCTAB. NEURO: Cranial nerves II-XII intact. No focal deficits. Speech clear. Sensation and motor function intact. Normal muscle strength of UE & LE. Gait is steady.   ED Results / Procedures / Treatments   Labs (all labs ordered are listed, but only abnormal results are displayed) Labs Reviewed - No data to display  No results found.  PROCEDURES:  Critical Care performed: No  Procedures   MEDICATIONS ORDERED IN ED: Medications  sodium chloride  0.9 % bolus 500 mL (500 mLs Intravenous New Bag/Given  09/30/24 2300)  ketorolac  (TORADOL ) 15 MG/ML injection 15 mg (15 mg Intravenous Given 09/30/24 2302)  ondansetron  (ZOFRAN ) injection 4 mg (4 mg Intravenous Given 09/30/24 2303)  dexamethasone  (DECADRON ) injection 10 mg (10 mg Intravenous Given 09/30/24 2303)  diphenhydrAMINE  (BENADRYL ) injection 25 mg (25 mg Intravenous Given 09/30/24 2302)     IMPRESSION / MDM / ASSESSMENT AND PLAN / ED COURSE  I reviewed the triage vital signs and the nursing notes.                               23 y.o. female presents to the emergency department for evaluation and treatment of headache. See HPI for further details.   Differential diagnosis includes, but is not limited to, intracranial hemorrhage, meningitis/encephalitis, previous head trauma, tension headache, temporal arteritis, migraine or migraine equivalent, and non-specific headache.    Patient's presentation is most consistent with acute, uncomplicated illness.  Patient is alert and oriented.  She is hemodynamic stable.  Vital signs are  within normal limits.  Physical exam findings are stated above with no red flag signs.  Normal neuroexam. No meningeal signs.  No indication for further workup with emergent imaging at this time.  Will administer migraine cocktail.  She is appropriate for discharge home with outpatient follow-up, was counseled to return to the ED for new or worsening symptoms. Patient agrees with plan. Work note provided.  FINAL CLINICAL IMPRESSION(S) / ED DIAGNOSES   Final diagnoses:  Migraine without aura and with status migrainosus, not intractable   Rx / DC Orders   ED Discharge Orders     None        Note:  This document was prepared using Dragon voice recognition software and may include unintentional dictation errors.    Margrette, Shilynn Hoch A, PA-C 10/01/24 0000  "

## 2024-09-30 NOTE — ED Triage Notes (Signed)
 Pt reports she developed migraine early this morning, pt reports light sensitivity and nausea. Pt reports taking prescribed medications with no relief.
